# Patient Record
Sex: Male | Born: 1995 | Race: Black or African American | Hispanic: No | Marital: Single | Smoking: Never smoker
Health system: Southern US, Community
[De-identification: ages and names within clinical notes are randomized; demographics above are authoritative.]

## PROBLEM LIST (undated history)

## (undated) HISTORY — PX: ADENOIDECTOMY: SUR15

## (undated) HISTORY — PX: TYMPANOSTOMY TUBE PLACEMENT: SHX32

---
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
## 2021-03-22 ENCOUNTER — Other Ambulatory Visit: Payer: Self-pay

## 2021-03-22 ENCOUNTER — Encounter (HOSPITAL_BASED_OUTPATIENT_CLINIC_OR_DEPARTMENT_OTHER): Payer: Self-pay

## 2021-03-22 ENCOUNTER — Emergency Department (HOSPITAL_BASED_OUTPATIENT_CLINIC_OR_DEPARTMENT_OTHER)
Admission: EM | Admit: 2021-03-22 | Discharge: 2021-03-22 | Disposition: A | Discharge status: Home / Self Care | Payer: Medicaid Other | Attending: Emergency Medicine | Admitting: Emergency Medicine

## 2021-03-22 ENCOUNTER — Emergency Department (HOSPITAL_BASED_OUTPATIENT_CLINIC_OR_DEPARTMENT_OTHER): Payer: Medicaid Other | Admitting: Radiology

## 2021-03-22 DIAGNOSIS — R079 Chest pain, unspecified: Secondary | ICD-10-CM

## 2021-03-22 DIAGNOSIS — R0789 Other chest pain: Secondary | ICD-10-CM | POA: Insufficient documentation

## 2021-03-22 LAB — BASIC METABOLIC PANEL
Anion gap: 7 (ref 5–15)
BUN: 7 mg/dL (ref 6–20)
CO2: 29 mmol/L (ref 22–32)
Calcium: 9.5 mg/dL (ref 8.9–10.3)
Chloride: 105 mmol/L (ref 98–111)
Creatinine, Ser: 0.8 mg/dL (ref 0.61–1.24)
GFR, Estimated: >60 mL/min (ref >60)
Glucose, Bld: 90 mg/dL (ref 70–99)
Potassium: 3.6 mmol/L (ref 3.5–5.1)
Sodium: 141 mmol/L (ref 135–145)

## 2021-03-22 LAB — CBC
HCT: 41 % (ref 39.0–52.0)
Hemoglobin: 13 g/dL (ref 13.0–17.0)
MCH: 27.6 pg (ref 26.0–34.0)
MCHC: 31.7 g/dL (ref 30.0–36.0)
MCV: 87 fL (ref 80.0–100.0)
Platelets: 163 10*3/uL (ref 150–400)
RBC: 4.71 MIL/uL (ref 4.22–5.81)
RDW: 13 % (ref 11.5–15.5)
WBC: 6.5 10*3/uL (ref 4.0–10.5)
nRBC: 0 % (ref 0.0–0.2)

## 2021-03-22 LAB — TROPONIN I (HIGH SENSITIVITY)
Troponin I (High Sensitivity): <2 ng/L (ref <18)
Troponin I (High Sensitivity): <2 ng/L (ref <18)

## 2021-03-22 NOTE — ED Triage Notes (Signed)
Pt arrives POV with c/o of left side chest pain around 10:30 am.  States chest pain has improved but has not completely resolved.  Denies nausea, vomiting and shortness of breath.

## 2021-03-22 NOTE — ED Provider Notes (Signed)
[facility] EMERGENCY DEPT Provider Note   CSN: [id number] Arrival date & time: 03/22/21  1354     History Chief Complaint  Patient presents with   Chest Pain    Stanley Strickland is a 25 y.o. male.   Chest Pain Associated symptoms: no abdominal pain, no back pain, no cough, no dizziness, no fever, no headache, no nausea, no numbness, no palpitations, no shortness of breath, no vomiting and no weakness   Patient presents for chest pain.  Onset 10:30 AM.  He works for [employer] and onset of pain was while he was working.  At the time, he bent over to lift up a box and felt a pain in his lower anterior left chest.  Since onset, pain has diminished.  He does state that it worsens with certain movements.  He denies any associated symptoms.  He has no personal history of any cardiac abnormality.  He denies any history of HTN, HLD, DM, or family history of ACS.   HPI: A 25 year old patient presents for evaluation of chest pain. Initial onset of pain was less than one hour ago. The patient's chest pain is sharp and is not worse with exertion. The patient's chest pain is middle- or left-sided, is not well-localized, is not described as heaviness/pressure/tightness and does not radiate to the arms/jaw/neck. The patient does not complain of nausea and denies diaphoresis. The patient has no history of stroke, has no history of peripheral artery disease, has not smoked in the past 90 days, denies any history of treated diabetes, has no relevant family history of coronary artery disease (first degree relative at less than age 30), is not hypertensive, has no history of hypercholesterolemia and does not have an elevated BMI (>=30).   History reviewed. No pertinent past medical history.  There are no problems to display for this patient.   Past Surgical History:  Procedure Laterality Date   ADENOIDECTOMY     TYMPANOSTOMY TUBE PLACEMENT         No family history on file.  Social History    Tobacco Use   Smoking status: Never   Smokeless tobacco: Never  Vaping Use   Vaping Use: Every day  Substance Use Topics   Alcohol use: Not Currently   Drug use: Never    Home Medications Prior to Admission medications   Medication Sig Start Date End Date Taking? Authorizing Provider  acetaminophen-codeine (TYLENOL #3) 300-30 MG per tablet Take 1-2 tablets by mouth every 6 (six) hours as needed for pain. 04/09/13   Marlon Pel, PA-C    Allergies    Patient has no known allergies.  Review of Systems   Review of Systems  Constitutional:  Negative for chills and fever.  HENT:  Negative for ear pain and sore throat.   Respiratory:  Negative for cough and shortness of breath.   Cardiovascular:  Positive for chest pain. Negative for palpitations.  Gastrointestinal:  Negative for abdominal pain, nausea and vomiting.  Genitourinary:  Negative for dysuria and hematuria.  Musculoskeletal:  Negative for arthralgias, back pain, gait problem, joint swelling and myalgias.  Skin:  Negative for color change and rash.  Neurological:  Negative for dizziness, seizures, syncope, weakness, light-headedness, numbness and headaches.  All other systems reviewed and are negative.  Physical Exam Updated Vital Signs BP 115/83 (BP Location: Right Arm)   Pulse 71   Temp 98.4 F (36.9 C) (Oral)   Resp 16   Ht 6\' 1"  (1.854 m)   Wt  68 kg   SpO2 100%   BMI 19.79 kg/m   Physical Exam Vitals and nursing note reviewed.  Constitutional:      General: He is not in acute distress.    Appearance: He is well-developed and normal weight. He is not ill-appearing, toxic-appearing or diaphoretic.  HENT:     Head: Normocephalic and atraumatic.  Eyes:     Extraocular Movements: Extraocular movements intact.     Conjunctiva/sclera: Conjunctivae normal.  Neck:     Vascular: No JVD.  Cardiovascular:     Rate and Rhythm: Normal rate and regular rhythm.     Heart sounds: No murmur heard. Pulmonary:      Effort: Pulmonary effort is normal. No tachypnea or respiratory distress.     Breath sounds: Normal breath sounds.  Chest:     Chest wall: Tenderness present.  Abdominal:     Palpations: Abdomen is soft.     Tenderness: There is no abdominal tenderness.  Musculoskeletal:     Cervical back: Normal range of motion and neck supple.     Right lower leg: No edema.     Left lower leg: No edema.  Skin:    General: Skin is warm and dry.  Neurological:     General: No focal deficit present.     Mental Status: He is alert and oriented to person, place, and time.     Cranial Nerves: No cranial nerve deficit.     Motor: No weakness.  Psychiatric:        Mood and Affect: Mood normal.        Behavior: Behavior normal.    ED Results / Procedures / Treatments   Labs (all labs ordered are listed, but only abnormal results are displayed) Labs Reviewed  BASIC METABOLIC PANEL  CBC  TROPONIN I (HIGH SENSITIVITY)  TROPONIN I (HIGH SENSITIVITY)    EKG EKG Interpretation  Date/Time:  Monday March 22 2021 14:03:58 EDT Ventricular Rate:  77 PR Interval:  146 QRS Duration: 88 QT Interval:  344 QTC Calculation: 389 R Axis:   90 Text Interpretation: Normal sinus rhythm Rightward axis Borderline ECG No old tracing to compare Confirmed by Meridee Score [id number]) on 03/22/2021 2:42:40 PM  Radiology DG Chest 2 View  Result Date: 03/22/2021 CLINICAL DATA:  Chest pain EXAM: CHEST - 2 VIEW COMPARISON:  None. FINDINGS: The heart size and mediastinal contours are within normal limits. Both lungs are clear. The visualized skeletal structures are unremarkable. IMPRESSION: No active cardiopulmonary disease. Electronically Signed   By: Signa Kell M.D.   On: 03/22/2021 14:44    Procedures Procedures   Medications Ordered in ED Medications - No data to display  ED Course  I have reviewed the triage vital signs and the nursing notes.  Pertinent labs & imaging results that were available during my  care of the patient were reviewed by me and considered in my medical decision making (see chart for details).    MDM Rules/Calculators/A&P HEAR Score: 0                         Patient is a 25 year old male who presents for left anterior chest pain.  Onset of chest pain was while moving a box at work.  Patient has no known history of ACS or risk factors for ACS.  Onset of pain was 10:30 AM.  On arrival, he reports that chest pain has resolved.  EKG is reassuring.  Patient's heart score  is a 0.  On exam, patient does have tenderness to palpation in the area of his previous chest pain.  Musculoskeletal etiology is suspected.  Patient did undergo laboratory work-up, including troponins, which was normal.  Patient was given reassurance and advised to treat his pain with ibuprofen.  He was discharged in good condition.  Final Clinical Impression(s) / ED Diagnoses Final diagnoses:  Chest pain, unspecified type    Rx / DC Orders ED Discharge Orders     None        Gloris Manchester, MD 03/23/21 1220

## 2022-12-14 ENCOUNTER — Telehealth: Payer: Self-pay

## 2022-12-14 NOTE — Telephone Encounter (Signed)
LVM for patient to call back [phone], or to call PCP office to schedule follow up apt. AS, CMA
